# Patient Record
Sex: Male | Born: 2005 | Hispanic: No | Marital: Single | Smoking: Never smoker
Health system: Southern US, Community
[De-identification: ages and names within clinical notes are randomized; demographics above are authoritative.]

---
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
## 2024-04-30 ENCOUNTER — Other Ambulatory Visit
Admission: RE | Admit: 2024-04-30 | Discharge: 2024-04-30 | Disposition: A | Discharge status: Home / Self Care | Source: Ambulatory Visit | Attending: Adolescent Medicine | Admitting: Adolescent Medicine

## 2024-04-30 DIAGNOSIS — R7309 Other abnormal glucose: Secondary | ICD-10-CM | POA: Insufficient documentation

## 2024-04-30 DIAGNOSIS — L83 Acanthosis nigricans: Secondary | ICD-10-CM | POA: Insufficient documentation

## 2024-04-30 DIAGNOSIS — R7989 Other specified abnormal findings of blood chemistry: Secondary | ICD-10-CM | POA: Insufficient documentation

## 2024-04-30 DIAGNOSIS — Z68.41 Body mass index (BMI) pediatric, greater than or equal to 95th percentile for age: Secondary | ICD-10-CM | POA: Insufficient documentation

## 2024-04-30 DIAGNOSIS — R635 Abnormal weight gain: Secondary | ICD-10-CM | POA: Insufficient documentation

## 2024-04-30 LAB — LIPID PANEL
Cholesterol: 154 mg/dL (ref 0–169)
HDL: 46 mg/dL (ref >40)
LDL Cholesterol: 90 mg/dL (ref 0–99)
Total CHOL/HDL Ratio: 3.3 ratio
Triglycerides: 88 mg/dL (ref <150)
VLDL: 18 mg/dL (ref 0–40)

## 2024-04-30 LAB — COMPREHENSIVE METABOLIC PANEL WITH GFR
ALT: 21 U/L (ref 0–44)
AST: 19 U/L (ref 15–41)
Albumin: 4.3 g/dL (ref 3.5–5.0)
Alkaline Phosphatase: 74 U/L (ref 38–126)
Anion gap: 9 (ref 5–15)
BUN: 13 mg/dL (ref 6–20)
CO2: 26 mmol/L (ref 22–32)
Calcium: 9.3 mg/dL (ref 8.9–10.3)
Chloride: 105 mmol/L (ref 98–111)
Creatinine, Ser: 0.74 mg/dL (ref 0.61–1.24)
GFR, Estimated: >60 mL/min (ref >60)
Glucose, Bld: 101 mg/dL — ABNORMAL HIGH (ref 70–99)
Potassium: 4.2 mmol/L (ref 3.5–5.1)
Sodium: 140 mmol/L (ref 135–145)
Total Bilirubin: 0.7 mg/dL (ref 0.0–1.2)
Total Protein: 7.4 g/dL (ref 6.5–8.1)

## 2024-04-30 LAB — HEMOGLOBIN A1C
Hgb A1c MFr Bld: 5.7 % — ABNORMAL HIGH (ref 4.8–5.6)
Mean Plasma Glucose: 117 mg/dL

## 2024-04-30 LAB — VITAMIN D 25 HYDROXY (VIT D DEFICIENCY, FRACTURES): Vit D, 25-Hydroxy: 31.44 ng/mL (ref 30–100)

## 2024-05-01 LAB — INSULIN, RANDOM: Insulin: 16 u[IU]/mL (ref 2.6–24.9)
